# Patient Record
Sex: Male | Born: 1990 | Race: White | Hispanic: No | Marital: Single | Smoking: Never smoker
Health system: Southern US, Community
[De-identification: ages and names within clinical notes are randomized; demographics above are authoritative.]

---

## 2014-07-06 ENCOUNTER — Emergency Department (HOSPITAL_COMMUNITY)
Admission: EM | Admit: 2014-07-06 | Discharge: 2014-07-06 | Disposition: A | Discharge status: Home / Self Care | Payer: No Typology Code available for payment source | Attending: Emergency Medicine | Admitting: Emergency Medicine

## 2014-07-06 ENCOUNTER — Encounter (HOSPITAL_COMMUNITY): Payer: Self-pay | Admitting: Family Medicine

## 2014-07-06 ENCOUNTER — Emergency Department (HOSPITAL_COMMUNITY): Payer: No Typology Code available for payment source

## 2014-07-06 DIAGNOSIS — S161XXA Strain of muscle, fascia and tendon at neck level, initial encounter: Secondary | ICD-10-CM | POA: Diagnosis not present

## 2014-07-06 DIAGNOSIS — S46911A Strain of unspecified muscle, fascia and tendon at shoulder and upper arm level, right arm, initial encounter: Secondary | ICD-10-CM | POA: Diagnosis not present

## 2014-07-06 DIAGNOSIS — Y998 Other external cause status: Secondary | ICD-10-CM | POA: Insufficient documentation

## 2014-07-06 DIAGNOSIS — S199XXA Unspecified injury of neck, initial encounter: Secondary | ICD-10-CM | POA: Diagnosis present

## 2014-07-06 DIAGNOSIS — Y9389 Activity, other specified: Secondary | ICD-10-CM | POA: Diagnosis not present

## 2014-07-06 DIAGNOSIS — Y92410 Unspecified street and highway as the place of occurrence of the external cause: Secondary | ICD-10-CM | POA: Insufficient documentation

## 2014-07-06 MED ORDER — DIAZEPAM 5 MG PO TABS
5.0000 mg | ORAL_TABLET | Freq: Once | ORAL | Status: AC
  Administered 2014-07-06: 5 mg via ORAL
  Filled 2014-07-06: qty 1

## 2014-07-06 MED ORDER — HYDROCODONE-ACETAMINOPHEN 5-325 MG PO TABS: 1.0000 | ORAL_TABLET | ORAL | Status: AC | PRN

## 2014-07-06 MED ORDER — NAPROXEN 500 MG PO TABS: 500.0000 mg | ORAL_TABLET | Freq: Two times a day (BID) | ORAL | Status: AC

## 2014-07-06 MED ORDER — DIAZEPAM 5 MG PO TABS: 5.0000 mg | ORAL_TABLET | Freq: Two times a day (BID) | ORAL | Status: AC | PRN

## 2014-07-06 MED ORDER — HYDROCODONE-ACETAMINOPHEN 5-325 MG PO TABS
1.0000 | ORAL_TABLET | Freq: Once | ORAL | Status: AC
  Administered 2014-07-06: 1 via ORAL
  Filled 2014-07-06: qty 1
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
## 2014-07-06 NOTE — ED Notes (Signed)
Patient transported to X-ray 
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
## 2016-08-08 IMAGING — DX DG SHOULDER 2+V*R*
2 series · 2 of 2 positions shown · non-contrast
Comparison: None.

CLINICAL DATA: Neck pain radiating to the right shoulder. Unable to
raise the right shoulder.

EXAM:
RIGHT SHOULDER - 2+ VIEW

[shoulder grashey]
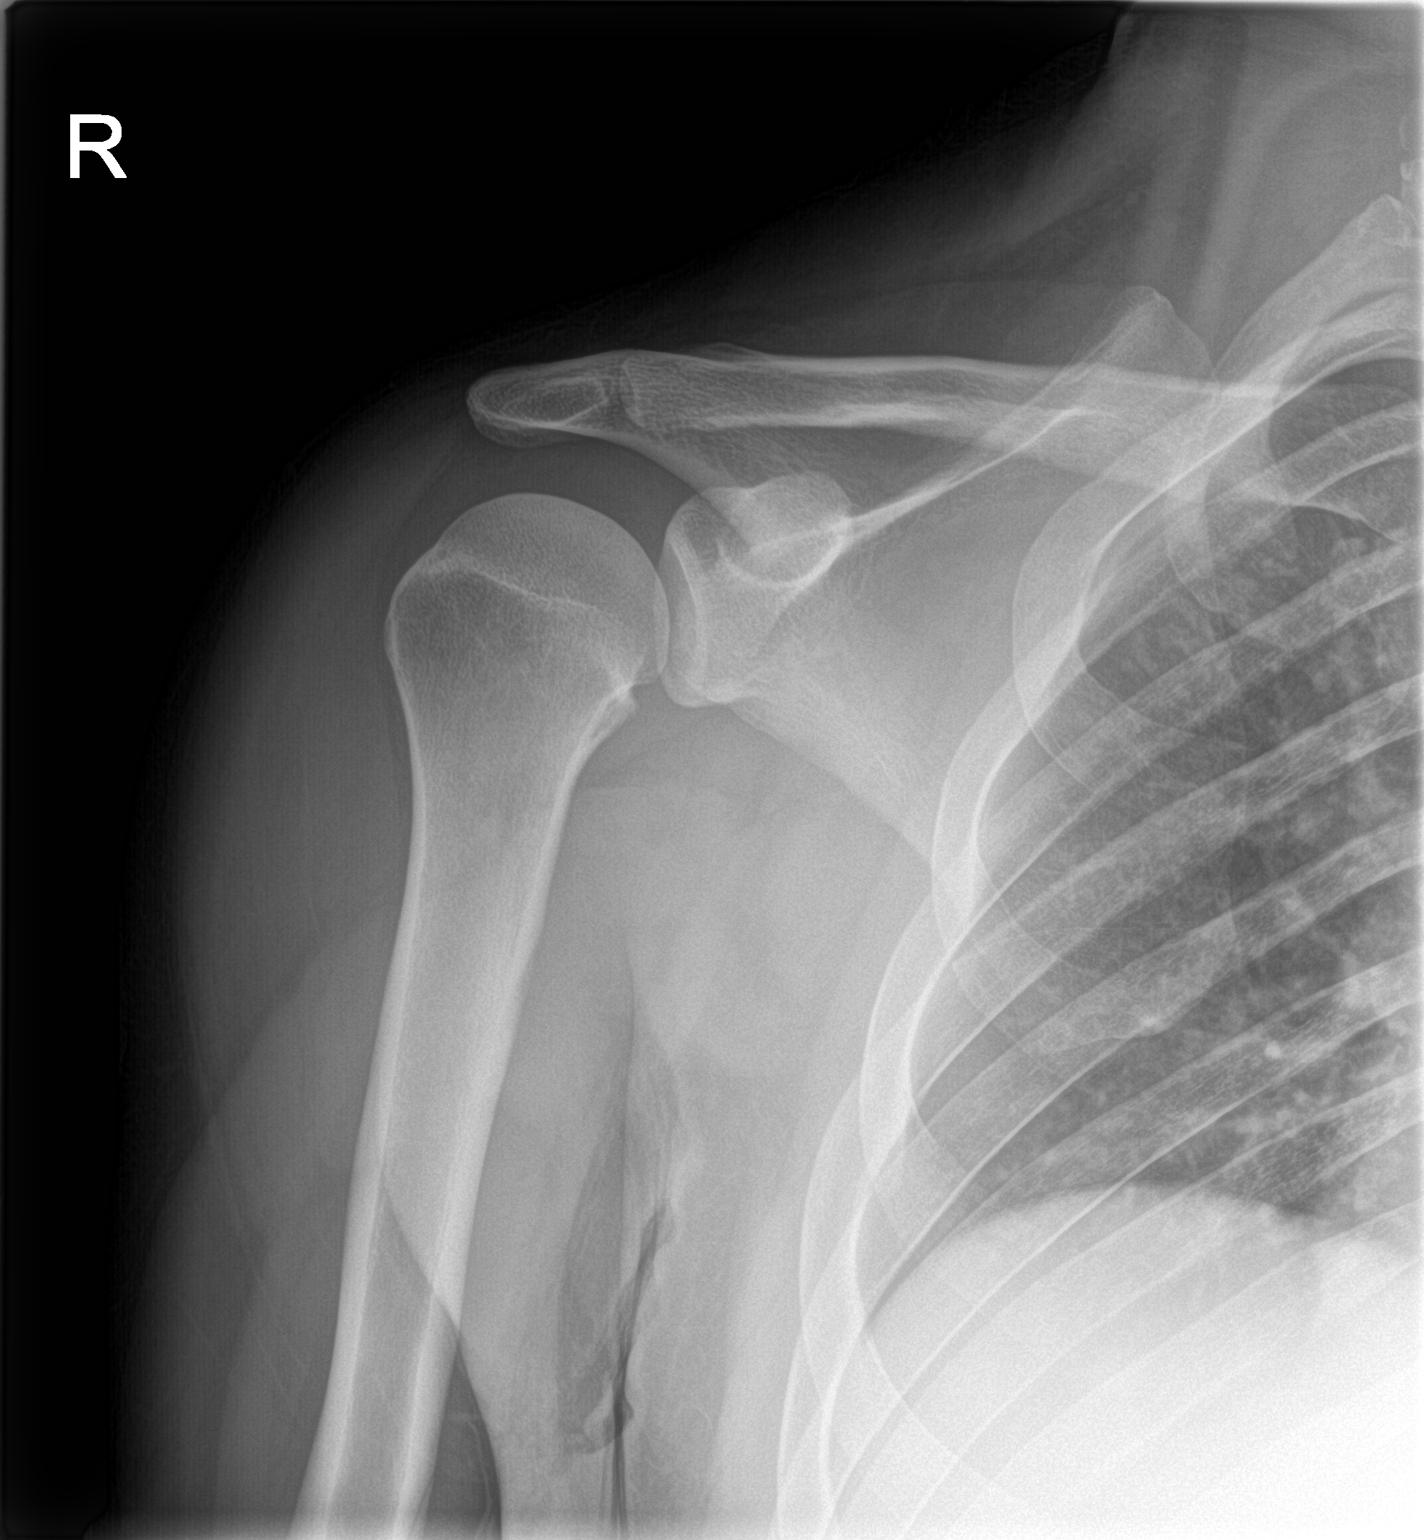

[shoulder y view]
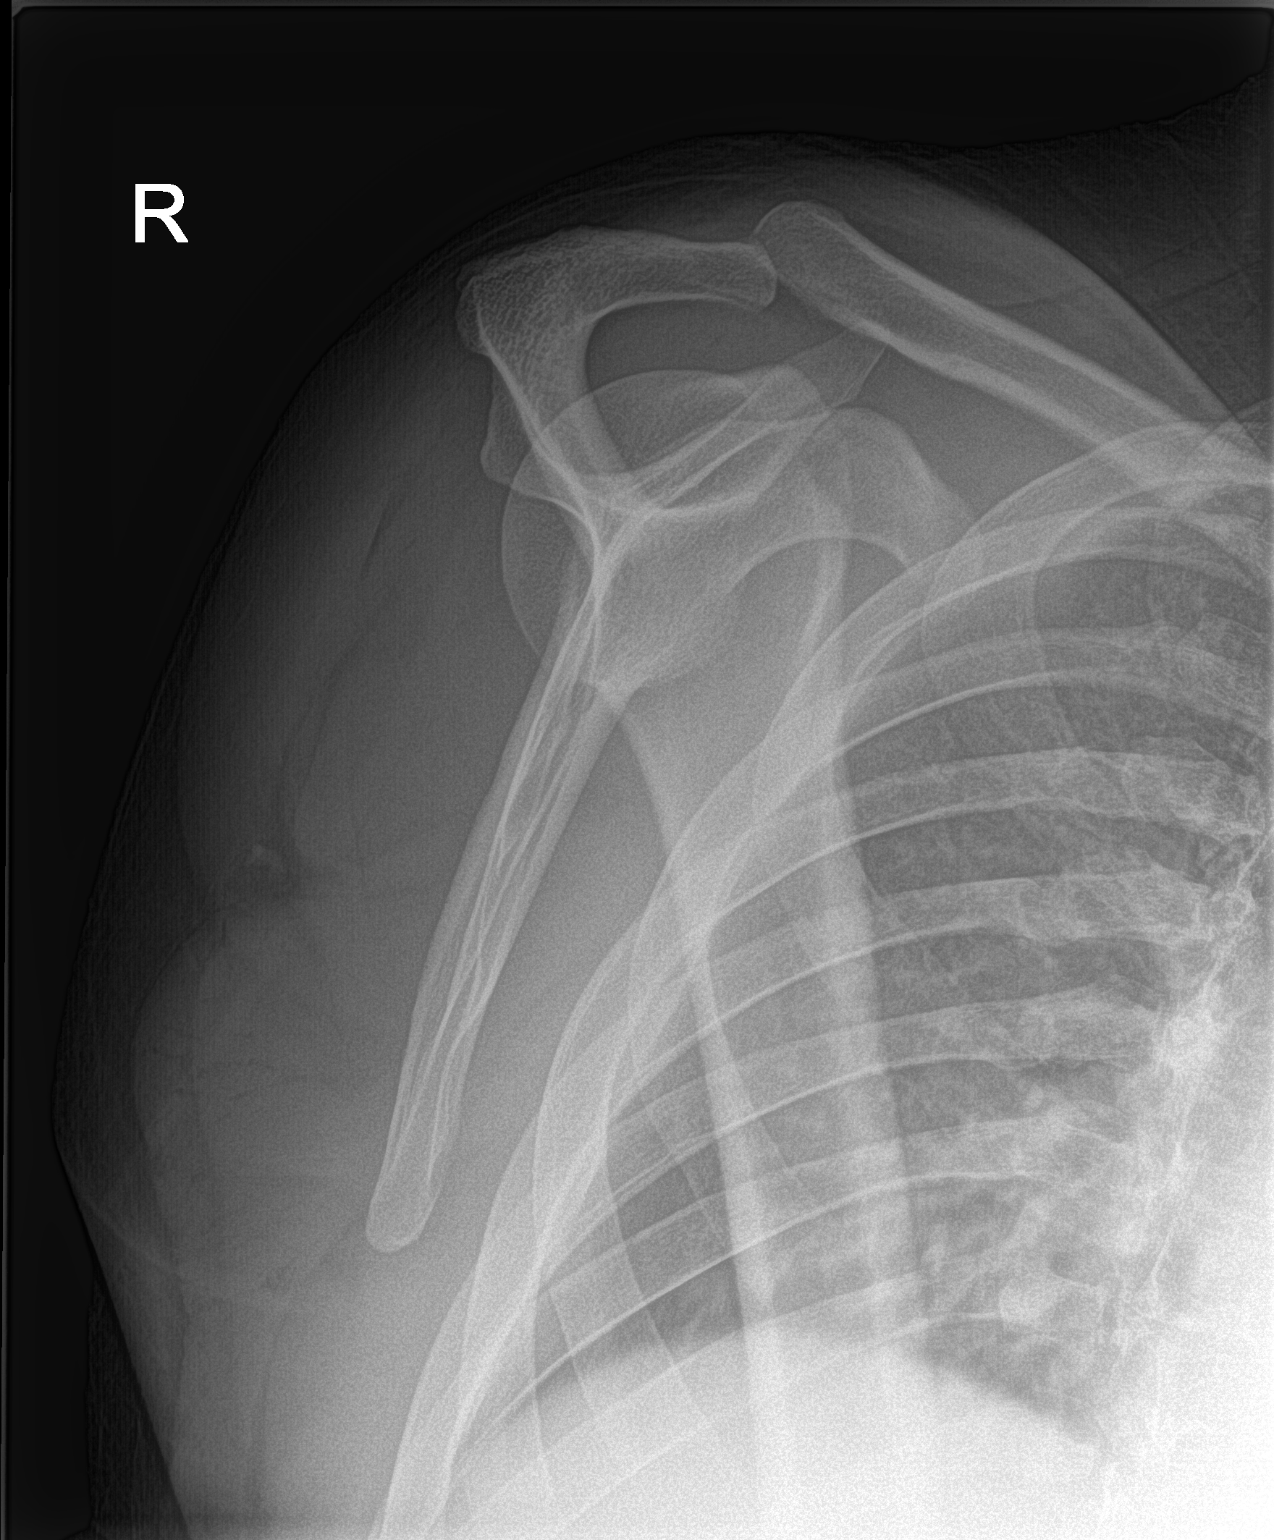

[2 of 2 positions shown; findings below may reference images not displayed]

FINDINGS: There is no evidence of fracture or dislocation. There is no
evidence of arthropathy or other focal bone abnormality. Soft
tissues are unremarkable.
IMPRESSION: Normal radiographs
